# Patient Record
Sex: Female | Born: 1955 | Race: White | Hispanic: No | Marital: Married | Smoking: Former smoker
Health system: Southern US, Community
[De-identification: ages and names within clinical notes are randomized; demographics above are authoritative.]

## PROBLEM LIST (undated history)

## (undated) DIAGNOSIS — B029 Zoster without complications: Secondary | ICD-10-CM

## (undated) DIAGNOSIS — K219 Gastro-esophageal reflux disease without esophagitis: Secondary | ICD-10-CM

## (undated) DIAGNOSIS — G4700 Insomnia, unspecified: Secondary | ICD-10-CM

## (undated) DIAGNOSIS — C801 Malignant (primary) neoplasm, unspecified: Secondary | ICD-10-CM

## (undated) DIAGNOSIS — E785 Hyperlipidemia, unspecified: Secondary | ICD-10-CM

## (undated) DIAGNOSIS — I10 Essential (primary) hypertension: Secondary | ICD-10-CM

## (undated) HISTORY — PX: BREAST BIOPSY: SHX20

## (undated) HISTORY — PX: COLONOSCOPY: SHX174

## (undated) HISTORY — PX: LASER ABLATION OF THE CERVIX: SHX1949

## (undated) HISTORY — PX: ABDOMINAL HYSTERECTOMY: SHX81

## (undated) HISTORY — PX: RADICAL VULVECTOMY: SHX2286

## (undated) HISTORY — PX: BACK SURGERY: SHX140

---

## 2003-11-07 ENCOUNTER — Ambulatory Visit: Payer: Self-pay

## 2005-09-16 ENCOUNTER — Ambulatory Visit: Payer: Self-pay

## 2008-02-26 ENCOUNTER — Ambulatory Visit: Payer: Self-pay | Admitting: Family Medicine

## 2009-09-30 ENCOUNTER — Ambulatory Visit: Payer: Self-pay | Admitting: Family Medicine

## 2010-12-01 ENCOUNTER — Ambulatory Visit: Payer: Self-pay | Admitting: Gynecology

## 2012-03-21 ENCOUNTER — Ambulatory Visit: Payer: Self-pay | Admitting: Gynecology

## 2013-01-18 HISTORY — PX: OTHER SURGICAL HISTORY: SHX169

## 2013-04-17 ENCOUNTER — Ambulatory Visit: Payer: Self-pay | Admitting: Family Medicine

## 2014-06-10 ENCOUNTER — Other Ambulatory Visit: Payer: Self-pay | Admitting: Family Medicine

## 2014-06-10 DIAGNOSIS — Z1231 Encounter for screening mammogram for malignant neoplasm of breast: Secondary | ICD-10-CM

## 2014-06-11 ENCOUNTER — Ambulatory Visit
Admission: RE | Admit: 2014-06-11 | Discharge: 2014-06-11 | Disposition: A | Discharge status: Home / Self Care | Payer: 59 | Source: Ambulatory Visit | Attending: Family Medicine | Admitting: Family Medicine

## 2014-06-11 DIAGNOSIS — Z1231 Encounter for screening mammogram for malignant neoplasm of breast: Secondary | ICD-10-CM | POA: Insufficient documentation

## 2014-06-11 DIAGNOSIS — Z8589 Personal history of malignant neoplasm of other organs and systems: Secondary | ICD-10-CM | POA: Insufficient documentation

## 2014-06-11 HISTORY — DX: Malignant (primary) neoplasm, unspecified: C80.1

## 2014-08-04 ENCOUNTER — Ambulatory Visit
Admission: EM | Admit: 2014-08-04 | Discharge: 2014-08-04 | Disposition: A | Discharge status: Home / Self Care | Payer: 59 | Attending: Internal Medicine | Admitting: Internal Medicine

## 2014-08-04 DIAGNOSIS — H109 Unspecified conjunctivitis: Secondary | ICD-10-CM

## 2014-08-04 DIAGNOSIS — H01003 Unspecified blepharitis right eye, unspecified eyelid: Secondary | ICD-10-CM | POA: Diagnosis not present

## 2014-08-04 DIAGNOSIS — H01006 Unspecified blepharitis left eye, unspecified eyelid: Secondary | ICD-10-CM

## 2014-08-04 HISTORY — DX: Essential (primary) hypertension: I10

## 2014-08-04 HISTORY — DX: Hyperlipidemia, unspecified: E78.5

## 2014-08-04 MED ORDER — PREDNISONE 20 MG PO TABS: 40.0000 mg | ORAL_TABLET | Freq: Every day | ORAL | Status: DC

## 2014-08-04 MED ORDER — SULFACETAMIDE SODIUM 10 % OP SOLN: 1.0000 [drp] | OPHTHALMIC | Status: DC

## 2014-08-04 NOTE — ED Notes (Signed)
Patient had eyelash extensions placed Friday. Thinks she had allergic reaction, eyes swelling and with redness. No visual changes.

## 2014-08-04 NOTE — ED Provider Notes (Signed)
CSN: [id number]     Arrival date & time 08/04/14  0808 History   First MD Initiated Contact with Patient 08/04/14 [phone]     Chief Complaint  Patient presents with  . Facial Swelling   (Consider location/radiation/quality/duration/timing/severity/associated sxs/prior Treatment) HPI   59 yo F had eyelash extentions put on 2 weeks ago with no problem initially. Had "eyelash goop" develop this past Thursday. She reported for repeat extensions on Friday ( 2 day PTA) discharge not considered significant and new extensions added. Patient developed inflamed irritated eyelids upon awakening yesterday. Intense itching and she rubbed a lot.Lids swollen,tender,eyes injected,no visual changes . Melted off her eyelashes with olive oil per instructions. Eyes aching.  Past Medical History  Diagnosis Date  . Cancer     vulva cancer  . Hypertension   . Hyperlipidemia    Past Surgical History  Procedure Laterality Date  . Breast biopsy Right     neg   History reviewed. No pertinent family history. History  Substance Use Topics  . Smoking status: Former Research scientist (life sciences)  . Smokeless tobacco: Never Used  . Alcohol Use: No   OB History    No data available     Review of Systems Review of 10 systems negative for acute change except as referenced in HPI  Allergies  Review of patient's allergies indicates no known allergies.  Home Medications   Prior to Admission medications   Medication Sig Start Date End Date Taking? Authorizing Provider  hydrochlorothiazide (MICROZIDE) 12.5 MG capsule Take 10 mg by mouth daily.   Yes Historical Provider, MD  lisinopril (PRINIVIL,ZESTRIL) 5 MG tablet Take 5 mg by mouth daily.   Yes Historical Provider, MD  rosuvastatin (CRESTOR) 20 MG tablet Take 20 mg by mouth daily.   Yes Historical Provider, MD  predniSONE (DELTASONE) 20 MG tablet Take 2 tablets (40 mg total) by mouth daily with breakfast. 08/04/14   Jan Fireman, PA-C  sulfacetamide (BLEPH-10) 10 % ophthalmic solution  Place 1-2 drops into both eyes every 4 (four) hours. 08/04/14   Jan Fireman, PA-C   BP 146/87 mmHg  Pulse 82  Temp(Src) 97.4 F (36.3 C) (Tympanic)  Resp 20  Ht 5\' 2"  (1.575 m)  Wt 170 lb (77.111 kg)  BMI 31.09 kg/m2  SpO2 96% Physical Exam   Constitutional -alert and oriented, Head-atraumatic, normocephalic Eyes- angry injected conjunctiva bilaterally ,EOMI intact ,conjugate gaze; lids bilaterally inflamed with abraded lash line especially on the left. She aknowledges much rubbing. Fluid filled lower lids and "bags" bilaterally. Acuity R and L  20/25 Nose- no congestion or rhinorrhea Mouth/throat- mucous membranes moist , Neck- supple CV- regular rate, grossly normal heart sounds,  Resp-no distress, normal respiratory effort,clear to auscultation bilaterally GI- no distention GU- not examined MSK-  ambulatory, self-care Neuro- normal speech and language, Skin-warm,dry ,intact; see HPI Psych-mood and affect grossly normal; speech and behavior grossly normal  ED Course  Procedures (including critical care time) Labs Review Labs Reviewed - No data to display  Imaging Review No results found.   MDM   1. Blepharitis of both eyes   2. Bilateral conjunctivitis     Plan: 1. diagnosis reviewed with patient 2. Rx as per orders; risks, benefits, potential side effects reviewed with patient 3. Recommend supportive treatment with cool eye mask-rest supine-don't touch -clean hands 4. F/u prn if symptoms worsen or don't improve   Discharge Medication List as of 08/04/2014  8:56 AM    START taking these medications  Details  predniSONE (DELTASONE) 20 MG tablet Take 2 tablets (40 mg total) by mouth daily with breakfast., Starting 08/04/2014, Until Discontinued, Normal    sulfacetamide (BLEPH-10) 10 % ophthalmic solution Place 1-2 drops into both eyes every 4 (four) hours., Starting 08/04/2014, Until Discontinued, Normal          Jan Fireman, PA-C 08/05/14 [phone]

## 2014-08-05 ENCOUNTER — Encounter: Payer: Self-pay | Admitting: Physician Assistant

## 2015-03-19 HISTORY — PX: BLADDER SUSPENSION: SHX72

## 2015-06-17 ENCOUNTER — Other Ambulatory Visit: Payer: Self-pay | Admitting: Gynecology

## 2015-06-17 DIAGNOSIS — Z1231 Encounter for screening mammogram for malignant neoplasm of breast: Secondary | ICD-10-CM

## 2015-06-18 ENCOUNTER — Ambulatory Visit: Payer: Self-pay

## 2015-07-01 ENCOUNTER — Ambulatory Visit: Payer: Self-pay

## 2015-07-01 ENCOUNTER — Ambulatory Visit: Admission: RE | Admit: 2015-07-01 | Payer: Self-pay | Source: Ambulatory Visit

## 2015-07-02 ENCOUNTER — Ambulatory Visit
Admission: RE | Admit: 2015-07-02 | Discharge: 2015-07-02 | Disposition: A | Discharge status: Home / Self Care | Payer: BLUE CROSS/BLUE SHIELD | Source: Ambulatory Visit | Attending: Gynecology | Admitting: Gynecology

## 2015-07-02 DIAGNOSIS — R928 Other abnormal and inconclusive findings on diagnostic imaging of breast: Secondary | ICD-10-CM | POA: Diagnosis not present

## 2015-07-02 DIAGNOSIS — Z1231 Encounter for screening mammogram for malignant neoplasm of breast: Secondary | ICD-10-CM | POA: Diagnosis not present

## 2015-07-07 ENCOUNTER — Other Ambulatory Visit: Payer: Self-pay | Admitting: Gynecology

## 2015-07-07 DIAGNOSIS — R928 Other abnormal and inconclusive findings on diagnostic imaging of breast: Secondary | ICD-10-CM

## 2015-07-17 ENCOUNTER — Other Ambulatory Visit: Payer: Self-pay | Admitting: Gynecology

## 2015-07-17 ENCOUNTER — Ambulatory Visit
Admission: RE | Admit: 2015-07-17 | Discharge: 2015-07-17 | Disposition: A | Discharge status: Home / Self Care | Payer: BLUE CROSS/BLUE SHIELD | Source: Ambulatory Visit | Attending: Gynecology | Admitting: Gynecology

## 2015-07-17 DIAGNOSIS — R928 Other abnormal and inconclusive findings on diagnostic imaging of breast: Secondary | ICD-10-CM

## 2015-07-23 ENCOUNTER — Other Ambulatory Visit: Payer: Self-pay | Admitting: General Practice

## 2015-07-23 ENCOUNTER — Other Ambulatory Visit: Payer: Self-pay | Admitting: Gynecology

## 2015-07-23 DIAGNOSIS — R928 Other abnormal and inconclusive findings on diagnostic imaging of breast: Secondary | ICD-10-CM

## 2015-07-24 ENCOUNTER — Ambulatory Visit
Admission: RE | Admit: 2015-07-24 | Discharge: 2015-07-24 | Disposition: A | Discharge status: Home / Self Care | Payer: BLUE CROSS/BLUE SHIELD | Source: Ambulatory Visit | Attending: Gynecology | Admitting: Gynecology

## 2015-07-24 DIAGNOSIS — R928 Other abnormal and inconclusive findings on diagnostic imaging of breast: Secondary | ICD-10-CM

## 2015-10-20 ENCOUNTER — Ambulatory Visit: Payer: Self-pay | Admitting: General Surgery

## 2015-10-20 DIAGNOSIS — N6022 Fibroadenosis of left breast: Secondary | ICD-10-CM

## 2015-10-28 ENCOUNTER — Other Ambulatory Visit: Payer: Self-pay | Admitting: General Surgery

## 2015-10-28 DIAGNOSIS — N6022 Fibroadenosis of left breast: Secondary | ICD-10-CM

## 2015-11-13 ENCOUNTER — Encounter (HOSPITAL_COMMUNITY)
Admission: RE | Admit: 2015-11-13 | Discharge: 2015-11-13 | Disposition: A | Discharge status: Home / Self Care | Payer: BLUE CROSS/BLUE SHIELD | Source: Ambulatory Visit | Attending: General Surgery | Admitting: General Surgery

## 2015-11-13 ENCOUNTER — Other Ambulatory Visit: Payer: Self-pay

## 2015-11-13 ENCOUNTER — Encounter (HOSPITAL_COMMUNITY): Payer: Self-pay

## 2015-11-13 DIAGNOSIS — N6489 Other specified disorders of breast: Secondary | ICD-10-CM | POA: Insufficient documentation

## 2015-11-13 DIAGNOSIS — Z01810 Encounter for preprocedural cardiovascular examination: Secondary | ICD-10-CM | POA: Insufficient documentation

## 2015-11-13 DIAGNOSIS — Z01812 Encounter for preprocedural laboratory examination: Secondary | ICD-10-CM | POA: Diagnosis not present

## 2015-11-13 HISTORY — DX: Gastro-esophageal reflux disease without esophagitis: K21.9

## 2015-11-13 HISTORY — DX: Insomnia, unspecified: G47.00

## 2015-11-13 LAB — CBC
HEMATOCRIT: 42.7 % (ref 36.0–46.0)
Hemoglobin: 13.7 g/dL (ref 12.0–15.0)
MCH: 29.7 pg (ref 26.0–34.0)
MCHC: 32.1 g/dL (ref 30.0–36.0)
MCV: 92.4 fL (ref 78.0–100.0)
Platelets: 313 10*3/uL (ref 150–400)
RBC: 4.62 MIL/uL (ref 3.87–5.11)
RDW: 13.3 % (ref 11.5–15.5)
WBC: 13.4 10*3/uL — ABNORMAL HIGH (ref 4.0–10.5)

## 2015-11-13 LAB — BASIC METABOLIC PANEL
ANION GAP: 10 (ref 5–15)
BUN: 17 mg/dL (ref 6–20)
CALCIUM: 9.6 mg/dL (ref 8.9–10.3)
CO2: 29 mmol/L (ref 22–32)
CREATININE: 1.24 mg/dL — AB (ref 0.44–1.00)
Chloride: 99 mmol/L — ABNORMAL LOW (ref 101–111)
GFR calc Af Amer: 54 mL/min — ABNORMAL LOW (ref >60)
GFR calc non Af Amer: 47 mL/min — ABNORMAL LOW (ref >60)
GLUCOSE: 165 mg/dL — AB (ref 65–99)
Potassium: 4.3 mmol/L (ref 3.5–5.1)
Sodium: 138 mmol/L (ref 135–145)

## 2015-11-13 MED ORDER — CHLORHEXIDINE GLUCONATE CLOTH 2 % EX PADS: 6.0000 | MEDICATED_PAD | Freq: Once | CUTANEOUS | Status: DC

## 2015-11-13 NOTE — Progress Notes (Signed)
Cardiologist denies  [facility],sees NP Verita Lamb  Echo/stress test/heart cath denies  EKG denies in past yr  CXR denies in past yr

## 2015-11-13 NOTE — Pre-Procedure Instructions (Signed)
Loretta Barker  11/13/2015      [id number] [place], [place] - [address] [place] [address] Phone: [phone] Fax: [phone]    Your procedure is scheduled on Wed, Nov 1 @ 8:30 AM  Report to [facility] Admitting at 6:30 AM  Call this number if you have problems the morning of surgery:  [phone]   Remember:  Do not eat food or drink liquids after midnight.   No Goody's,BC's,Aleve,Advil,Motrin,Ibuprofen,Fish Oil,or any Herbal Medications.    Do not wear jewelry, make-up or nail polish.  Do not wear lotions, powders, perfumes, or deoderant.  Do not shave 48 hours prior to surgery.    Do not bring valuables to the hospital.  [facility] is not responsible for any belongings or valuables.  Contacts, dentures or bridgework may not be worn into surgery.  Leave your suitcase in the car.  After surgery it may be brought to your room.  For patients admitted to the hospital, discharge time will be determined by your treatment team.  Patients discharged the day of surgery will not be allowed to drive home.   [facility]  Before surgery, you can play an important role.  Because skin is not sterile, your skin needs to be as free of germs as possible.  You can reduce the number of germs on you skin by washing with CHG (chlorahexidine gluconate) soap before surgery.  CHG is an antiseptic cleaner which kills germs and bonds with the skin to continue killing germs even after washing.  Please DO NOT use if you have an allergy to CHG or antibacterial soaps.  If your skin becomes reddened/irritated stop using the CHG and inform your nurse when you arrive at Short Stay.  Do not shave (including legs and underarms) for at least 48 hours prior to the first CHG shower.  You may shave your face.  Please follow these instructions carefully:   1.  Shower with CHG Soap the night before surgery and the                                 morning of Surgery.  2.  If you choose to wash your hair, wash your hair first as usual with your       normal shampoo.  3.  After you shampoo, rinse your hair and body thoroughly to remove the                      Shampoo.  4.  Use CHG as you would any other liquid soap.  You can apply chg directly       to the skin and wash gently with scrungie or a clean washcloth.  5.  Apply the CHG Soap to your body ONLY FROM THE NECK DOWN.        Do not use on open wounds or open sores.  Avoid contact with your eyes,       ears, mouth and genitals (private parts).  Wash genitals (private parts)       with your normal soap.  6.  Wash thoroughly, paying special attention to the area where your surgery        will be performed.  7.  Thoroughly rinse your body with warm water from the neck down.  8.  DO NOT shower/wash with  your normal soap after using and rinsing off       the CHG Soap.  9.  Pat yourself dry with a clean towel.            10.  Wear clean pajamas.            11.  Place clean sheets on your bed the night of your first shower and do not        sleep with pets.  Day of Surgery  Do not apply any lotions/deoderants the morning of surgery.  Please wear clean clothes to the hospital/surgery center.    Please read over the following fact sheets that you were given. Pain Booklet, Coughing and Deep Breathing and Surgical Site Infection Prevention

## 2015-11-18 ENCOUNTER — Ambulatory Visit
Admission: RE | Admit: 2015-11-18 | Discharge: 2015-11-18 | Disposition: A | Discharge status: Home / Self Care | Payer: BLUE CROSS/BLUE SHIELD | Source: Ambulatory Visit | Attending: General Surgery | Admitting: General Surgery

## 2015-11-18 DIAGNOSIS — N6022 Fibroadenosis of left breast: Secondary | ICD-10-CM

## 2015-11-19 ENCOUNTER — Ambulatory Visit
Admission: RE | Admit: 2015-11-19 | Discharge: 2015-11-19 | Disposition: A | Discharge status: Home / Self Care | Payer: BLUE CROSS/BLUE SHIELD | Source: Ambulatory Visit | Attending: General Surgery | Admitting: General Surgery

## 2015-11-19 ENCOUNTER — Ambulatory Visit (HOSPITAL_COMMUNITY)
Admission: RE | Admit: 2015-11-19 | Discharge: 2015-11-19 | Disposition: A | Discharge status: Home / Self Care | Payer: BLUE CROSS/BLUE SHIELD | Source: Ambulatory Visit | Attending: General Surgery | Admitting: General Surgery

## 2015-11-19 ENCOUNTER — Encounter (HOSPITAL_COMMUNITY)
Admission: RE | Disposition: A | Discharge status: Home / Self Care | Payer: Self-pay | Source: Ambulatory Visit | Attending: General Surgery

## 2015-11-19 ENCOUNTER — Ambulatory Visit (HOSPITAL_COMMUNITY): Payer: BLUE CROSS/BLUE SHIELD | Admitting: Anesthesiology

## 2015-11-19 ENCOUNTER — Encounter (HOSPITAL_COMMUNITY): Payer: Self-pay | Admitting: Certified Registered"

## 2015-11-19 DIAGNOSIS — Z8541 Personal history of malignant neoplasm of cervix uteri: Secondary | ICD-10-CM | POA: Insufficient documentation

## 2015-11-19 DIAGNOSIS — F329 Major depressive disorder, single episode, unspecified: Secondary | ICD-10-CM | POA: Insufficient documentation

## 2015-11-19 DIAGNOSIS — N6022 Fibroadenosis of left breast: Secondary | ICD-10-CM | POA: Insufficient documentation

## 2015-11-19 DIAGNOSIS — Z87891 Personal history of nicotine dependence: Secondary | ICD-10-CM | POA: Diagnosis not present

## 2015-11-19 DIAGNOSIS — N6012 Diffuse cystic mastopathy of left breast: Secondary | ICD-10-CM | POA: Diagnosis not present

## 2015-11-19 DIAGNOSIS — K219 Gastro-esophageal reflux disease without esophagitis: Secondary | ICD-10-CM | POA: Diagnosis not present

## 2015-11-19 DIAGNOSIS — G4730 Sleep apnea, unspecified: Secondary | ICD-10-CM | POA: Diagnosis not present

## 2015-11-19 DIAGNOSIS — E7800 Pure hypercholesterolemia, unspecified: Secondary | ICD-10-CM | POA: Diagnosis not present

## 2015-11-19 DIAGNOSIS — I10 Essential (primary) hypertension: Secondary | ICD-10-CM | POA: Insufficient documentation

## 2015-11-19 DIAGNOSIS — Z90710 Acquired absence of both cervix and uterus: Secondary | ICD-10-CM | POA: Diagnosis not present

## 2015-11-19 HISTORY — PX: BREAST LUMPECTOMY WITH RADIOACTIVE SEED LOCALIZATION: SHX6424

## 2015-11-19 HISTORY — PX: BREAST EXCISIONAL BIOPSY: SUR124

## 2015-11-19 SURGERY — BREAST LUMPECTOMY WITH RADIOACTIVE SEED LOCALIZATION
Anesthesia: General | Laterality: Left

## 2015-11-19 MED ORDER — LACTATED RINGERS IV SOLN
INTRAVENOUS | Status: DC | PRN
  Administered 2015-11-19 (×2): via INTRAVENOUS

## 2015-11-19 MED ORDER — FENTANYL CITRATE (PF) 100 MCG/2ML IJ SOLN
25.0000 ug | INTRAMUSCULAR | Status: DC | PRN
  Administered 2015-11-19 (×3): 25 ug via INTRAVENOUS
  Administered 2015-11-19: 50 ug via INTRAVENOUS
  Administered 2015-11-19: 25 ug via INTRAVENOUS

## 2015-11-19 MED ORDER — PROPOFOL 10 MG/ML IV BOLUS
INTRAVENOUS | Status: AC
  Filled 2015-11-19: qty 20

## 2015-11-19 MED ORDER — LIDOCAINE 2% (20 MG/ML) 5 ML SYRINGE
INTRAMUSCULAR | Status: AC
  Filled 2015-11-19: qty 5

## 2015-11-19 MED ORDER — OXYCODONE HCL 5 MG/5ML PO SOLN: 5.0000 mg | Freq: Once | ORAL | Status: AC | PRN

## 2015-11-19 MED ORDER — LIDOCAINE 2% (20 MG/ML) 5 ML SYRINGE
INTRAMUSCULAR | Status: DC | PRN
  Administered 2015-11-19: 50 mg via INTRAVENOUS

## 2015-11-19 MED ORDER — OXYCODONE HCL 5 MG PO TABS
ORAL_TABLET | ORAL | Status: AC
  Filled 2015-11-19: qty 1

## 2015-11-19 MED ORDER — BUPIVACAINE-EPINEPHRINE (PF) 0.5% -1:200000 IJ SOLN
INTRAMUSCULAR | Status: AC
  Filled 2015-11-19: qty 30

## 2015-11-19 MED ORDER — 0.9 % SODIUM CHLORIDE (POUR BTL) OPTIME
TOPICAL | Status: DC | PRN
  Administered 2015-11-19: 1000 mL

## 2015-11-19 MED ORDER — MIDAZOLAM HCL 5 MG/5ML IJ SOLN
INTRAMUSCULAR | Status: DC | PRN
  Administered 2015-11-19: 2 mg via INTRAVENOUS

## 2015-11-19 MED ORDER — ONDANSETRON HCL 4 MG/2ML IJ SOLN
INTRAMUSCULAR | Status: DC | PRN
  Administered 2015-11-19: 4 mg via INTRAVENOUS

## 2015-11-19 MED ORDER — PROPOFOL 10 MG/ML IV BOLUS
INTRAVENOUS | Status: DC | PRN
  Administered 2015-11-19: 160 mg via INTRAVENOUS

## 2015-11-19 MED ORDER — MIDAZOLAM HCL 2 MG/2ML IJ SOLN
INTRAMUSCULAR | Status: AC
  Filled 2015-11-19: qty 2

## 2015-11-19 MED ORDER — ONDANSETRON HCL 4 MG/2ML IJ SOLN: 4.0000 mg | Freq: Once | INTRAMUSCULAR | Status: DC | PRN

## 2015-11-19 MED ORDER — BUPIVACAINE-EPINEPHRINE 0.5% -1:200000 IJ SOLN
INTRAMUSCULAR | Status: DC | PRN
  Administered 2015-11-19: 10 mL

## 2015-11-19 MED ORDER — FENTANYL CITRATE (PF) 100 MCG/2ML IJ SOLN
INTRAMUSCULAR | Status: DC
Start: 2015-11-19 — End: 2015-11-19
  Filled 2015-11-19: qty 2

## 2015-11-19 MED ORDER — FENTANYL CITRATE (PF) 100 MCG/2ML IJ SOLN
INTRAMUSCULAR | Status: AC
  Filled 2015-11-19: qty 2

## 2015-11-19 MED ORDER — CEFAZOLIN SODIUM-DEXTROSE 2-4 GM/100ML-% IV SOLN
2.0000 g | INTRAVENOUS | Status: AC
  Administered 2015-11-19: 2 g via INTRAVENOUS

## 2015-11-19 MED ORDER — LACTATED RINGERS IV SOLN: INTRAVENOUS | Status: DC | PRN

## 2015-11-19 MED ORDER — ONDANSETRON HCL 4 MG/2ML IJ SOLN
INTRAMUSCULAR | Status: AC
  Filled 2015-11-19: qty 2

## 2015-11-19 MED ORDER — OXYCODONE HCL 5 MG PO TABS
5.0000 mg | ORAL_TABLET | Freq: Once | ORAL | Status: AC | PRN
  Administered 2015-11-19: 5 mg via ORAL

## 2015-11-19 MED ORDER — HYDROCODONE-ACETAMINOPHEN 5-325 MG PO TABS: 1.0000 | ORAL_TABLET | ORAL | 0 refills | Status: DC | PRN

## 2015-11-19 MED ORDER — CEFAZOLIN SODIUM-DEXTROSE 2-4 GM/100ML-% IV SOLN
INTRAVENOUS | Status: AC
  Filled 2015-11-19: qty 100

## 2015-11-19 MED ORDER — FENTANYL CITRATE (PF) 100 MCG/2ML IJ SOLN
INTRAMUSCULAR | Status: DC | PRN
  Administered 2015-11-19 (×4): 25 ug via INTRAVENOUS

## 2015-11-19 SURGICAL SUPPLY — 37 items
APPLIER CLIP 9.375 MED OPEN (MISCELLANEOUS)
BLADE SURG 15 STRL LF DISP TIS (BLADE) ×1 IMPLANT
BLADE SURG 15 STRL SS (BLADE) ×2
CANISTER SUCTION 2500CC (MISCELLANEOUS) ×3 IMPLANT
CHLORAPREP W/TINT 26ML (MISCELLANEOUS) ×3 IMPLANT
CLIP APPLIE 9.375 MED OPEN (MISCELLANEOUS) IMPLANT
COVER PROBE W GEL 5X96 (DRAPES) ×3 IMPLANT
COVER SURGICAL LIGHT HANDLE (MISCELLANEOUS) ×3 IMPLANT
DERMABOND ADVANCED (GAUZE/BANDAGES/DRESSINGS) ×2
DERMABOND ADVANCED .7 DNX12 (GAUZE/BANDAGES/DRESSINGS) ×1 IMPLANT
DEVICE DUBIN SPECIMEN MAMMOGRA (MISCELLANEOUS) ×3 IMPLANT
DRAPE CHEST BREAST 15X10 FENES (DRAPES) ×3 IMPLANT
DRAPE UTILITY XL STRL (DRAPES) ×3 IMPLANT
ELECT COATED BLADE 2.86 ST (ELECTRODE) ×3 IMPLANT
ELECT REM PT RETURN 9FT ADLT (ELECTROSURGICAL) ×3
ELECTRODE REM PT RTRN 9FT ADLT (ELECTROSURGICAL) ×1 IMPLANT
GLOVE BIO SURGEON STRL SZ7.5 (GLOVE) ×6 IMPLANT
GOWN STRL REUS W/ TWL LRG LVL3 (GOWN DISPOSABLE) ×2 IMPLANT
GOWN STRL REUS W/TWL LRG LVL3 (GOWN DISPOSABLE) ×4
KIT BASIN OR (CUSTOM PROCEDURE TRAY) ×3 IMPLANT
KIT MARKER MARGIN INK (KITS) ×3 IMPLANT
LIGHT WAVEGUIDE WIDE FLAT (MISCELLANEOUS) ×3 IMPLANT
NEEDLE HYPO 25X1 1.5 SAFETY (NEEDLE) ×3 IMPLANT
NS IRRIG 1000ML POUR BTL (IV SOLUTION) ×3 IMPLANT
PACK SURGICAL SETUP 50X90 (CUSTOM PROCEDURE TRAY) ×3 IMPLANT
PENCIL BUTTON HOLSTER BLD 10FT (ELECTRODE) ×3 IMPLANT
SPONGE LAP 18X18 X RAY DECT (DISPOSABLE) ×3 IMPLANT
SUT MNCRL AB 4-0 PS2 18 (SUTURE) ×3 IMPLANT
SUT SILK 2 0 SH (SUTURE) IMPLANT
SUT VIC AB 3-0 SH 18 (SUTURE) ×3 IMPLANT
SYR BULB 3OZ (MISCELLANEOUS) ×3 IMPLANT
SYR CONTROL 10ML LL (SYRINGE) ×3 IMPLANT
TOWEL OR 17X24 6PK STRL BLUE (TOWEL DISPOSABLE) ×3 IMPLANT
TOWEL OR 17X26 10 PK STRL BLUE (TOWEL DISPOSABLE) ×3 IMPLANT
TUBE CONNECTING 12'X1/4 (SUCTIONS) ×1
TUBE CONNECTING 12X1/4 (SUCTIONS) ×2 IMPLANT
YANKAUER SUCT BULB TIP NO VENT (SUCTIONS) ×3 IMPLANT

## 2015-11-19 NOTE — Anesthesia Procedure Notes (Signed)
Procedure Name: LMA Insertion Date/Time: 11/19/2015 8:29 AM Performed by: Manuela Schwartz B Pre-anesthesia Checklist: Patient identified, Emergency Drugs available, Suction available, Timeout performed and Patient being monitored Patient Re-evaluated:Patient Re-evaluated prior to inductionOxygen Delivery Method: Circle system utilized Preoxygenation: Pre-oxygenation with 100% oxygen Intubation Type: IV induction LMA: LMA inserted LMA Size: 4.0 Number of attempts: 1 Placement Confirmation: positive ETCO2 and breath sounds checked- equal and bilateral Tube secured with: Tape Dental Injury: Teeth and Oropharynx as per pre-operative assessment

## 2015-11-19 NOTE — Interval H&P Note (Signed)
History and Physical Interval Note:  11/19/2015 7:07 AM  Loretta Barker  has presented today for surgery, with the diagnosis of LEFT BREAST COMPLEX SCLEROSING LESION  The various methods of treatment have been discussed with the patient and family. After consideration of risks, benefits and other options for treatment, the patient has consented to  Procedure(s): LEFT BREAST LUMPECTOMY WITH RADIOACTIVE SEED LOCALIZATION (Left) as a surgical intervention .  The patient's history has been reviewed, patient examined, no change in status, stable for surgery.  I have reviewed the patient's chart and labs.  Questions were answered to the patient's satisfaction.     TOTH III,PAUL S

## 2015-11-19 NOTE — Op Note (Signed)
11/19/2015  9:25 AM  PATIENT:  Loretta Barker  60 y.o. female  PRE-OPERATIVE DIAGNOSIS:  LEFT BREAST COMPLEX SCLEROSING LESION  POST-OPERATIVE DIAGNOSIS:  LEFT BREAST COMPLEX SCLEROSING LESION  PROCEDURE:  Procedure(s): LEFT BREAST LUMPECTOMY WITH RADIOACTIVE SEED LOCALIZATION (Left)  SURGEON:  Surgeon(s) and Role:    * Jovita Kussmaul, MD - Primary  PHYSICIAN ASSISTANT:   ASSISTANTS: none   ANESTHESIA:   local and general  EBL:  Total I/O In: 1000 [I.V.:1000] Out: -   BLOOD ADMINISTERED:none  DRAINS: none   LOCAL MEDICATIONS USED:  MARCAINE     SPECIMEN:  Source of Specimen:  left breast tissue  DISPOSITION OF SPECIMEN:  PATHOLOGY  COUNTS:  YES  TOURNIQUET:  * No tourniquets in log *  DICTATION: .Dragon Dictation   After informed consent was obtained the patient was brought to the operating room and placed in the supine position on the operating room table. After adequate induction of general anesthesia the patient's left breast was prepped with ChloraPrep, allowed to dry, and draped in usual sterile manner. An appropriate timeout was performed. Previously an I-125 seed was placed in the central left breast to mark an area of a complex sclerosing lesion. I elected to access the breast tissue through a inframammary incision. This area was infiltrated with half percent Marcaine and an inframammary incision was made with a 15 blade knife. The incision was carried through the skin and subcutaneous tissue sharply with the electrocautery until the breast tissue was entered. The dissection was directed by the use of a neoprobe. Once we began to approach the area of the radioactive seed and then removed a circular portion of breast tissue around the radioactive seed while checking the area of radioactivity frequently. Once the specimen was removed it was oriented with the appropriate paint colors. A specimen radiograph showed the clip in seed to be within the specimen. The specimen  was then sent to pathology for further evaluation. Hemostasis was achieved using the Bovie electrocautery. The wound was irrigated with saline and infiltrated with quarter percent Marcaine. The cavity was marked with 2 clips. The deep layer of the wound was then closed with layers of interrupted 3-0 Vicryl stitches. The skin was then closed with interrupted 4-0 Monocryl subcuticular stitches. Dermabond dressings were applied. The patient tolerated the procedure well. At the end of the case all needle sponge and instrument counts were correct. The patient was then awakened and taken to recovery in stable condition.  PLAN OF CARE: Discharge to home after PACU  PATIENT DISPOSITION:  PACU - hemodynamically stable.   Delay start of Pharmacological VTE agent (>24hrs) due to surgical blood loss or risk of bleeding: not applicable

## 2015-11-19 NOTE — Anesthesia Preprocedure Evaluation (Addendum)
Anesthesia Evaluation  Patient identified by MRN, date of birth, ID band Patient awake    Reviewed: Allergy & Precautions, NPO status , Patient's Chart, lab work & pertinent test results  Airway Mallampati: II  TM Distance: >3 FB Neck ROM: Full    Dental  (+) Teeth Intact, Dental Advisory Given   Pulmonary former smoker,    breath sounds clear to auscultation       Cardiovascular hypertension,  Rhythm:Regular Rate:Normal     Neuro/Psych    GI/Hepatic   Endo/Other    Renal/GU      Musculoskeletal   Abdominal   Peds  Hematology   Anesthesia Other Findings   Reproductive/Obstetrics                            Anesthesia Physical Anesthesia Plan  ASA: II  Anesthesia Plan: General   Post-op Pain Management:    Induction: Intravenous  Airway Management Planned: LMA  Additional Equipment:   Intra-op Plan:   Post-operative Plan:   Informed Consent: I have reviewed the patients History and Physical, chart, labs and discussed the procedure including the risks, benefits and alternatives for the proposed anesthesia with the patient or authorized representative who has indicated his/her understanding and acceptance.   Dental advisory given  Plan Discussed with: CRNA and Anesthesiologist  Anesthesia Plan Comments:        Anesthesia Quick Evaluation

## 2015-11-19 NOTE — Transfer of Care (Signed)
Immediate Anesthesia Transfer of Care Note  Patient: Loretta Barker  Procedure(s) Performed: Procedure(s): LEFT BREAST LUMPECTOMY WITH RADIOACTIVE SEED LOCALIZATION (Left)  Patient Location: PACU  Anesthesia Type:General  Level of Consciousness: awake, alert  and oriented  Airway & Oxygen Therapy: Patient Spontanous Breathing and Patient connected to face mask oxygen  Post-op Assessment: Report given to RN and Post -op Vital signs reviewed and stable  Post vital signs: Reviewed and stable  Last Vitals:  Vitals:   11/19/15 0935 11/19/15 0945  BP:  (!) 162/73  Pulse:  91  Resp:  (!) 8  Temp: 36.4 C     Last Pain:  Vitals:   11/19/15 0945  TempSrc:   PainSc: Asleep      Patients Stated Pain Goal: 3 ([id number])  Complications: No apparent anesthesia complications

## 2015-11-19 NOTE — H&P (Signed)
Loretta Barker. Loretta Barker  Location: [facility] Patient #: [id number] DOB: 08/26/1955 Married / Language: English / Race: White Female   History of Present Illness  The patient is a 60 year old female who presents with a breast mass. We are asked to see the patient in consultation by Dr. Jetta Lout to evaluate her for a complex sclerosing lesion of the left breast. The patient is a 60 year old white female who recently went for a routine screening mammogram. At that time she was found to have an area of architectural distortion that was small in the deep 6:00 subareolar position of the left breast. This was biopsied and came back as a complex sclerosing lesion. The patient does have a personal history of vulvar and cervical cancer approximately 20 years ago. She has no personal or family history of breast cancer. 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 She has been thinking about this for a number of years.   Other Problems  Back Pain Bladder Problems Cancer Cervical Cancer Depression Gastroesophageal Reflux Disease Hemorrhoids High blood pressure Hypercholesterolemia Lump In Breast Oophorectomy Left. Sleep Apnea  Past Surgical History  Breast Biopsy Left. Hysterectomy (due to cancer) - Complete  Diagnostic Studies History  Colonoscopy 5-10 years ago Mammogram within last year Pap Smear 1-5 years ago  Allergies  No Known Drug Allergies  Medication History  HydroCHLOROthiazide (12.5MG  Capsule, Oral) Active. Lisinopril (5MG  Tablet, Oral) Active. Crestor (20MG  Tablet, Oral) Active. PredniSONE (20MG  Tablet, Oral two tablets daily) Active. Medications Reconciled  Social History  Alcohol use Occasional alcohol use. Caffeine use Carbonated beverages, Coffee. No drug use Tobacco use Former smoker.  Family History Heart  disease in female family member before age 88 Heart disease in female family member before age 63 Hypertension Father, Mother.  Pregnancy / Birth History  Age at menarche 11 years. Age of menopause 63-50 Contraceptive History Oral contraceptives. Gravida 3 Irregular periods Maternal age 43-20 Para 2    Review of Systems  General Not Present- Appetite Loss, Chills, Fatigue, Fever, Night Sweats, Weight Gain and Weight Loss. Skin Not Present- Change in Wart/Mole, Dryness, Hives, Jaundice, New Lesions, Non-Healing Wounds, Rash and Ulcer. HEENT Present- Wears glasses/contact lenses. Not Present- Earache, Hearing Loss, Hoarseness, Nose Bleed, Oral Ulcers, Ringing in the Ears, Seasonal Allergies, Sinus Pain, Sore Throat, Visual Disturbances and Yellow Eyes. Respiratory Not Present- Bloody sputum, Chronic Cough, Difficulty Breathing, Snoring and Wheezing. Breast Present- Breast Mass. Not Present- Breast Pain, Nipple Discharge and Skin Changes. Cardiovascular Present- Leg Cramps. Not Present- Chest Pain, Difficulty Breathing Lying Down, Palpitations, Rapid Heart Rate, Shortness of Breath and Swelling of Extremities. Gastrointestinal Not Present- Abdominal Pain, Bloating, Bloody Stool, Change in Bowel Habits, Chronic diarrhea, Constipation, Difficulty Swallowing, Excessive gas, Gets full quickly at meals, Hemorrhoids, Indigestion, Nausea, Rectal Pain and Vomiting. Female Genitourinary Not Present- Frequency, Nocturia, Painful Urination, Pelvic Pain and Urgency. Musculoskeletal Not Present- Back Pain, Joint Pain, Joint Stiffness, Muscle Pain, Muscle Weakness and Swelling of Extremities. Neurological Not Present- Decreased Memory, Fainting, Headaches, Numbness, Seizures, Tingling, Tremor, Trouble walking and Weakness. Psychiatric Not Present- Anxiety, Bipolar, Change in Sleep Pattern, Depression, Fearful and Frequent crying. Endocrine Present- Hot flashes. Not Present- Cold Intolerance,  Excessive Hunger, Hair Changes, Heat Intolerance and New Diabetes. Hematology Not Present- Blood Thinners, Easy Bruising, Excessive bleeding, Gland problems, HIV and Persistent Infections.  Vitals  Weight: 173 lb Height:  62in Body Surface Area: 1.8 m Body Mass Index: 31.64 kg/m  Temp.: 97.1F  Pulse: 52 (Regular)  BP: 126/78 (Sitting, Left Arm, Standard)       Physical Exam  General Mental Status-Alert. General Appearance-Consistent with stated age. Hydration-Well hydrated. Voice-Normal.  Head and Neck Head-normocephalic, atraumatic with no lesions or palpable masses. Trachea-midline. Thyroid Gland Characteristics - normal size and consistency.  Eye Eyeball - Bilateral-Extraocular movements intact. Sclera/Conjunctiva - Bilateral-No scleral icterus.  Chest and Lung Exam Chest and lung exam reveals -quiet, even and easy respiratory effort with no use of accessory muscles and on auscultation, normal breath sounds, no adventitious sounds and normal vocal resonance. Inspection Chest Wall - Normal. Back - normal.  Breast Note: She has very large pendulous breasts bilaterally. There is no palpable mass in either breast. There is no palpable axillary, supraclavicular, or cervical lymphadenopathy.   Cardiovascular Cardiovascular examination reveals -normal heart sounds, regular rate and rhythm with no murmurs and normal pedal pulses bilaterally.  Abdomen Inspection Inspection of the abdomen reveals - No Hernias. Skin - Scar - no surgical scars. Palpation/Percussion Palpation and Percussion of the abdomen reveal - Soft, Non Tender, No Rebound tenderness, No Rigidity (guarding) and No hepatosplenomegaly. Auscultation Auscultation of the abdomen reveals - Bowel sounds normal.  Neurologic Neurologic evaluation reveals -alert and oriented x 3 with no impairment of recent or remote memory. Mental Status-Normal.  Musculoskeletal Normal  Exam - Left-Upper Extremity Strength Normal and Lower Extremity Strength Normal. Normal Exam - Right-Upper Extremity Strength Normal and Lower Extremity Strength Normal.  Lymphatic Head & Neck  General Head & Neck Lymphatics: Bilateral - Description - Normal. Axillary  General Axillary Region: Bilateral - Description - Normal. Tenderness - Non Tender. Femoral & Inguinal  Generalized Femoral & Inguinal Lymphatics: Bilateral - Description - Normal. Tenderness - Non Tender.    Assessment & Plan SCLEROSING ADENOSIS OF BREAST, LEFT (N60.22) Impression: The patient appears to have a small area of complex sclerosing lesion in the 6 o'clock position of the left breast in the deep subareolar area. Because of its abnormal appearance and because it is considered a high risk lesion the recommendation would be to have this area removed. I have discussed with her in detail the risks and benefits of the operation to do this as well as some of the technical aspects and she understands and wishes to proceed. She is also very interested in breast reduction. She has thought about it for a long time and it has been recommended to her by her back doctor. I think her lumpectomy could be done as part of a breast reduction. I will refer her to the plastic surgeons to talk about the options for surgery. We will coordinate with them for surgical planning. Current Plans Referred to Surgery - Plastic, for evaluation and follow up (Plastic Surgery). Routine.

## 2015-11-19 NOTE — Anesthesia Postprocedure Evaluation (Signed)
Anesthesia Post Note  Patient: Loretta Barker  Procedure(s) Performed: Procedure(s) (LRB): LEFT BREAST LUMPECTOMY WITH RADIOACTIVE SEED LOCALIZATION (Left)  Patient location during evaluation: PACU Anesthesia Type: General Level of consciousness: awake, awake and alert and oriented Pain management: pain level controlled Vital Signs Assessment: post-procedure vital signs reviewed and stable Respiratory status: spontaneous breathing, nonlabored ventilation and respiratory function stable Cardiovascular status: blood pressure returned to baseline Anesthetic complications: no    Last Vitals:  Vitals:   11/19/15 1045 11/19/15 1107  BP:  130/74  Pulse: 88 72  Resp: 18 16  Temp:      Last Pain:  Vitals:   11/19/15 1107  TempSrc:   PainSc: 2                  Rosanna Bickle COKER

## 2015-11-20 ENCOUNTER — Encounter (HOSPITAL_COMMUNITY): Payer: Self-pay | Admitting: General Surgery

## 2015-12-31 ENCOUNTER — Encounter: Payer: Self-pay | Admitting: Hematology

## 2015-12-31 ENCOUNTER — Telehealth: Payer: Self-pay | Admitting: Hematology

## 2015-12-31 NOTE — Telephone Encounter (Signed)
Tc made to pt to schedule appt. Previous attempts were made on 12/1 and 12/5 w/ no response. Was able to reach the pt today and scheduled appt w/Feng on 12/21 at 11am. Pt agreed. Demographics verified, letter mailed.

## 2016-01-07 ENCOUNTER — Telehealth: Payer: Self-pay | Admitting: Hematology

## 2016-01-07 NOTE — Telephone Encounter (Signed)
Pt cld to cancel appt w/Dr. Burr Medico on 12/21 due to illness. Pt states she will cb in the beginning of the year to reschedule.

## 2016-01-08 ENCOUNTER — Encounter: Payer: BLUE CROSS/BLUE SHIELD | Admitting: Hematology

## 2016-05-04 ENCOUNTER — Other Ambulatory Visit: Payer: Self-pay | Admitting: General Surgery

## 2016-05-04 DIAGNOSIS — N6022 Fibroadenosis of left breast: Secondary | ICD-10-CM

## 2016-05-28 ENCOUNTER — Ambulatory Visit
Admission: RE | Admit: 2016-05-28 | Discharge: 2016-05-28 | Disposition: A | Discharge status: Home / Self Care | Payer: BLUE CROSS/BLUE SHIELD | Source: Ambulatory Visit | Attending: General Surgery | Admitting: General Surgery

## 2016-05-28 DIAGNOSIS — N6022 Fibroadenosis of left breast: Secondary | ICD-10-CM

## 2017-02-09 ENCOUNTER — Other Ambulatory Visit: Payer: Self-pay | Admitting: Gynecology

## 2017-02-09 DIAGNOSIS — Z1231 Encounter for screening mammogram for malignant neoplasm of breast: Secondary | ICD-10-CM

## 2017-03-01 ENCOUNTER — Ambulatory Visit
Admission: RE | Admit: 2017-03-01 | Discharge: 2017-03-01 | Disposition: A | Discharge status: Home / Self Care | Payer: BLUE CROSS/BLUE SHIELD | Source: Ambulatory Visit | Attending: Gynecology | Admitting: Gynecology

## 2017-03-01 DIAGNOSIS — Z1231 Encounter for screening mammogram for malignant neoplasm of breast: Secondary | ICD-10-CM

## 2018-01-27 ENCOUNTER — Other Ambulatory Visit: Payer: Self-pay | Admitting: Gynecology

## 2018-01-27 DIAGNOSIS — Z1231 Encounter for screening mammogram for malignant neoplasm of breast: Secondary | ICD-10-CM

## 2018-03-02 ENCOUNTER — Ambulatory Visit: Payer: BLUE CROSS/BLUE SHIELD

## 2018-04-04 ENCOUNTER — Ambulatory Visit: Payer: BLUE CROSS/BLUE SHIELD

## 2018-08-28 ENCOUNTER — Other Ambulatory Visit: Payer: Self-pay

## 2018-08-28 ENCOUNTER — Encounter: Payer: Self-pay | Admitting: Emergency Medicine

## 2018-08-28 ENCOUNTER — Ambulatory Visit
Admission: EM | Admit: 2018-08-28 | Discharge: 2018-08-28 | Disposition: A | Discharge status: Home / Self Care | Payer: BLUE CROSS/BLUE SHIELD | Attending: Family Medicine | Admitting: Family Medicine

## 2018-08-28 DIAGNOSIS — L03113 Cellulitis of right upper limb: Secondary | ICD-10-CM | POA: Diagnosis not present

## 2018-08-28 MED ORDER — MUPIROCIN 2 % EX OINT: TOPICAL_OINTMENT | CUTANEOUS | 0 refills | Status: AC

## 2018-08-28 MED ORDER — DOXYCYCLINE HYCLATE 100 MG PO CAPS: 100.0000 mg | ORAL_CAPSULE | Freq: Two times a day (BID) | ORAL | 0 refills | Status: AC

## 2018-08-28 NOTE — Discharge Instructions (Addendum)
Take medication as prescribed. Rest. Keep clean. Monitor.   Follow up with your primary care physician this week as needed. Return to Urgent care for new or worsening concerns.

## 2018-08-28 NOTE — ED Provider Notes (Signed)
[facility] ____________________________________________  Time seen: Approximately 2:49 PM  I have reviewed the triage vital signs and the nursing notes.   HISTORY  Chief Complaint Insect Bite (spider) and Cellulitis   HPI Loretta Barker is a 63 y.o. female presenting for evaluation of redness, swelling and warmth to her right forearm.  Patient reports Friday evening she noticed these very small red area and did not think much about it.  Patient reports in the last day she has had increased redness and tenderness to the area.  Patient states that she thinks that it is from an insect bite or spider bite.  No known insect bites.  No tick attachments.  Denies known fevers.  States the area is mildly tender.  Unresolved with cleaning and antibiotic ointment.  Denies other relieving factors reports otherwise doing well.  No recent cough, fevers or sickness. Reports tetanus immunizations up-to-date.  Verita Lamb, NP: PCP   Past Medical History:  Diagnosis Date  . 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 GERD (gastroesophageal reflux disease)    takes Nexium daily  . Hyperlipidemia    takes Crestor nightly  . Hypertension    takes Lisinopril and HCTZ daily  . Insomnia    takes Trazodone nightly    There are no active problems to display for this patient.   Past Surgical History:  Procedure Laterality Date  . ABDOMINAL HYSTERECTOMY    . BACK SURGERY     x 2   . BLADDER SUSPENSION  03/2015  . BREAST BIOPSY Right    Stereo- Benign  . BREAST EXCISIONAL BIOPSY Left 11/19/2015  . BREAST LUMPECTOMY WITH RADIOACTIVE SEED LOCALIZATION Left 11/19/2015   Procedure: LEFT BREAST LUMPECTOMY WITH RADIOACTIVE SEED LOCALIZATION;  Surgeon: Autumn Messing III, MD;  Location: [facility];  Service: General;  Laterality: Left;  . COLONOSCOPY    . LASER ABLATION OF THE CERVIX    . precancer cells removed from cervix  2015  . RADICAL VULVECTOMY  21 yrs ago     No current facility-administered  medications for this encounter.   Current Outpatient Medications:  .  aspirin EC 81 MG tablet, Take 81 mg by mouth daily., Disp: , Rfl:  .  calcium carbonate (OSCAL) 1500 (600 Ca) MG TABS tablet, Take 600 mg of elemental calcium by mouth daily with breakfast., Disp: , Rfl:  .  esomeprazole (NEXIUM) 20 MG capsule, Take 20 mg by mouth daily at 12 noon., Disp: , Rfl:  .  FIBER PO, Take 1 capsule by mouth 2 (two) times daily., Disp: , Rfl:  .  hydrochlorothiazide (MICROZIDE) 12.5 MG capsule, Take 12.5 mg by mouth daily. , Disp: , Rfl:  .  lisinopril (PRINIVIL,ZESTRIL) 5 MG tablet, Take 5 mg by mouth daily., Disp: , Rfl:  .  Potassium 99 MG TABS, Take 99 mg by mouth 2 (two) times daily., Disp: , Rfl:  .  rosuvastatin (CRESTOR) 20 MG tablet, Take 20 mg by mouth daily., Disp: , Rfl:  .  traZODone (DESYREL) 50 MG tablet, TAKE 1 TABLET (50 MG TOTAL) BY MOUTH NIGHTLY., Disp: , Rfl: 1 .  acetaminophen (TYLENOL) 500 MG tablet, Take 1,000 mg by mouth every 6 (six) hours as needed for mild pain., Disp: , Rfl:  .  doxycycline (VIBRAMYCIN) 100 MG capsule, Take 1 capsule (100 mg total) by mouth 2 (two) times daily., Disp: 20 capsule, Rfl: 0 .  mupirocin ointment (BACTROBAN) 2 %, Apply two times a day for 7 days.,  Disp: 22 g, Rfl: 0  Allergies Patient has no known allergies.  Family History  Problem Relation Age of Onset  . Breast cancer Neg Hx     Social History Social History   Tobacco Use  . Smoking status: Former Research scientist (life sciences)  . Smokeless tobacco: Never Used  . Tobacco comment: quit smoking 2 yrs ago  Substance Use Topics  . Alcohol use: Yes    Comment: rarely  . Drug use: No    Review of Systems Constitutional: No fever ENT: No sore throat. Cardiovascular: Denies chest pain. Respiratory: Denies shortness of breath. Gastrointestinal: No abdominal pain.  No nausea, no vomiting Musculoskeletal: Negative for back pain. Skin: Positive for rash.   ____________________________________________    PHYSICAL EXAM:  VITAL SIGNS: ED Triage Vitals  Enc Vitals Group     BP 08/28/18 1435 (!) 144/70     Pulse Rate 08/28/18 1435 92     Resp 08/28/18 1435 14     Temp 08/28/18 1435 98.8 F (37.1 C)     Temp Source 08/28/18 1435 Oral     SpO2 08/28/18 1435 97 %     Weight 08/28/18 1430 160 lb (72.6 kg)     Height 08/28/18 1430 5\' 3"  (1.6 m)     Head Circumference --      Peak Flow --      Pain Score 08/28/18 1430 5     Pain Loc --      Pain Edu? --      Excl. in [place]? --     Constitutional: Alert and oriented. Well appearing and in no acute distress. Eyes: Conjunctivae are normal. ENT      Head: Normocephalic and atraumatic. Cardiovascular: Normal rate, regular rhythm. Grossly normal heart sounds.  Good peripheral circulation. Respiratory: Normal respiratory effort without tachypnea nor retractions. Breath sounds are clear and equal bilaterally. No wheezes, rales, rhonchi. Musculoskeletal: Steady gait.  Neurologic:  Normal speech and language. Speech is normal. No gait instability.  Skin:  Skin is warm, dry.  Except: Right forearm 1 x 1.5 cm area of erythema with mild induration, no fluctuance with 9 x 13 cm surrounding erythema, mild tenderness to palpation, no drainage, skin intact, no circumferential edema, right forearm otherwise nontender and with full range of motion present.  Bilateral distal radial pulses equal nasal palpated.  Bilateral hand grip strong and equal. Psychiatric: Mood and affect are normal. Speech and behavior are normal. Patient exhibits appropriate insight and judgment   ___________________________________________   LABS (all labs ordered are listed, but only abnormal results are displayed)  Labs Reviewed - No data to display ____________________________________________   PROCEDURES Procedures     INITIAL IMPRESSION / ASSESSMENT AND PLAN / ED COURSE  Pertinent labs & imaging results that were available during my care of the patient were reviewed  by me and considered in my medical decision making (see chart for details).  Well-appearing patient.  No acute distress.  Right forearm area of cellulitis.  Will treat with oral doxycycline and topical Bactroban.  Discussed supportive care and monitoring, keep clean.Discussed indication, risks and benefits of medications with patient.  Discussed follow up with Primary care physician this week. Discussed follow up and return parameters including no resolution or any worsening concerns. Patient verbalized understanding and agreed to plan.   ____________________________________________   FINAL CLINICAL IMPRESSION(S) / ED DIAGNOSES  Final diagnoses:  Cellulitis of right upper extremity     ED Discharge Orders  Ordered    doxycycline (VIBRAMYCIN) 100 MG capsule  2 times daily     08/28/18 1450    mupirocin ointment (BACTROBAN) 2 %     08/28/18 1450           Note: This dictation was prepared with Dragon dictation along with smaller phrase technology. Any transcriptional errors that result from this process are unintentional.         Marylene Land, NP 08/28/18 1609

## 2018-08-28 NOTE — ED Triage Notes (Signed)
Patient states that she think she might got bit by a spider on Friday.  Patient c/o redness, swelling, pain and warmth at the site.  Patient denies fever.

## 2018-11-29 ENCOUNTER — Encounter: Payer: Self-pay | Admitting: Emergency Medicine

## 2018-11-29 ENCOUNTER — Other Ambulatory Visit: Payer: Self-pay

## 2018-11-29 ENCOUNTER — Ambulatory Visit
Admission: EM | Admit: 2018-11-29 | Discharge: 2018-11-29 | Disposition: A | Discharge status: Other Acute Inpatient Hospital | Payer: BLUE CROSS/BLUE SHIELD | Attending: Family Medicine | Admitting: Family Medicine

## 2018-11-29 DIAGNOSIS — E876 Hypokalemia: Secondary | ICD-10-CM | POA: Insufficient documentation

## 2018-11-29 LAB — BASIC METABOLIC PANEL
Anion gap: 16 — ABNORMAL HIGH (ref 5–15)
BUN: 15 mg/dL (ref 8–23)
CO2: 32 mmol/L (ref 22–32)
Calcium: 7.8 mg/dL — ABNORMAL LOW (ref 8.9–10.3)
Chloride: 91 mmol/L — ABNORMAL LOW (ref 98–111)
Creatinine, Ser: 0.96 mg/dL (ref 0.44–1.00)
GFR calc Af Amer: >60 mL/min (ref >60)
GFR calc non Af Amer: >60 mL/min (ref >60)
Glucose, Bld: 181 mg/dL — ABNORMAL HIGH (ref 70–99)
Potassium: 2.3 mmol/L — CL (ref 3.5–5.1)
Sodium: 139 mmol/L (ref 135–145)

## 2018-11-29 NOTE — ED Provider Notes (Signed)
[facility]    CSN: [id number] Arrival date & time: 11/29/18  1909  History   Chief Complaint Chief Complaint  Patient presents with  . Labs Only   HPI  63 year old female presents for evaluation given recent abnormal laboratory studies.  Patient was seen by her primary care physician earlier today for routine follow-up.  Labs were drawn and she was found to have a critically low potassium of 2.5.  CO2 was also greater than 40.  She was advised to go to the ER.  She presents for repeat labs today.  Patient states that she is currently feeling well.  She reports bilateral arm pain secondary to immunizations that she received at her doctor's office.  She has no other complaints at this time.  Denies chest pain or shortness of breath.  She is otherwise feeling well.  She is perplexed at the cause of her hypokalemia.  She is quite concerned.  No other complaints at this time.  PMH, Surgical Hx, Family Hx, Social History reviewed and updated as below.  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   Migraine stopped in her 70's   Depression    Wears glasses    GERD (gastroesophageal reflux disease)    Vulvar cancer (CMS-HCC)  21 years ago  DM-2   Surgical Hx: VULVA SURGERY      PR DESTRUCTION,LESION(S),VULVA,SIMPLE 06/26/2013 Bilateral Procedure: DESTRUCTION OF LESION(S), VULVA; SIMPLE (EG, LASER SURGERY, ELECTROSURGERY, CRYOSURGERY, CHEMOSURGERY); Surgeon: Marti Sleigh, MD; Location: [facility] OR [facility]; Service: Gynecology Oncology   BACK SURGERY   x2 Lumbar    PR LAPAROSCOPY W TOT HYSTERECTUTERUS <=250 [address] [place] TUBE/OVARY 03/31/2015 Midline Procedure: robotic assisted total laparoscopic hysterectomy, bilateral salpingo-oophorectomyinsertion, cystourethroscopy; Surgeon: Azzie Glatter, MD; Location: OR  [facility]; Service: Gynecology  Medical devices from this surgery are in the Implants section.   PR LAPAROSCOPY, SURG, COLPOPEXY 03/31/2015 Midline Procedure: ROBOTIC LAPAROSCOPY, SURGICAL, COLPOPEXY (SUSPENSION OF VAGINAL APEX); Surgeon: Azzie Glatter, MD; Location: OR [facility]; Service: Gynecology  Medical devices from this surgery are in the Implants section.   PR SLING OPER STRES INCONTINENCE 03/31/2015 Midline Procedure: SLING OPERATION FOR STRESS INCONTINENCE (EG, FASCIA OR SYNTHETIC); Surgeon: Azzie Glatter, MD; Location: OR [facility]; Service: Gynecology  Medical devices from this surgery are in the Implants section.   PR CYSTOURETHROSCOPY 03/31/2015 Midline Procedure: CYSTOURETHROSCOPY (SEPARATE PROCEDURE); Surgeon: Azzie Glatter, MD; Location: OR [facility]; Service: Gynecology  Medical devices from this surgery are in the Implants section.   LAPAROSCOPIC TOTAL HYSTERECTOMY      BREAST LUMPECTOMY  Left    HYSTERECTOMY      PR REVISION VAGINAL GRAFT 11/30/2016 Midline Procedure: Revision / excision vaginal mesh, vaginal approach; Surgeon: Azzie Glatter, MD; Location: [facility]; Service: Gynecology   PR CYSTOURETHROSCOPY 11/30/2016 Midline Procedure: CYSTOURETHROSCOPY (SEPARATE PROCEDURE); Surgeon: Azzie Glatter, MD; Location: [facility]; Service: Gynecology      Home Medications    Prior to Admission medications   Medication Sig Start Date End Date Taking? Authorizing Provider  acetaminophen (TYLENOL) 500 MG tablet Take 1,000 mg by mouth every 6 (six) hours as needed for mild pain.   Yes [provider]  aspirin EC 81 MG tablet Take 81 mg by mouth daily.   Yes [provider]  calcium carbonate (OSCAL) 1500 (600 Ca) MG TABS tablet Take 600  mg of elemental calcium by mouth daily with breakfast.   Yes [provider]  esomeprazole (NEXIUM) 20 MG capsule Take 20 mg by mouth daily at 12 noon.   Yes [provider]   FIBER PO Take 1 capsule by mouth 2 (two) times daily.   Yes [provider]  hydrochlorothiazide (MICROZIDE) 12.5 MG capsule Take 12.5 mg by mouth daily.    Yes [provider]  lisinopril (PRINIVIL,ZESTRIL) 5 MG tablet Take 5 mg by mouth daily.   Yes [provider]  mupirocin ointment (BACTROBAN) 2 % Apply two times a day for 7 days. 08/28/18  Yes Marylene Land, NP  Potassium 99 MG TABS Take 99 mg by mouth 2 (two) times daily.   Yes [provider]  rosuvastatin (CRESTOR) 20 MG tablet Take 20 mg by mouth daily.   Yes [provider]  traZODone (DESYREL) 50 MG tablet TAKE 1 TABLET (50 MG TOTAL) BY MOUTH NIGHTLY. 08/28/15  Yes [provider]  doxycycline (VIBRAMYCIN) 100 MG capsule Take 1 capsule (100 mg total) by mouth 2 (two) times daily. 08/28/18   Marylene Land, NP    Family History Family History  Problem Relation Age of Onset  . Breast cancer Neg Hx     Social History Social History   Tobacco Use  . Smoking status: Former Research scientist (life sciences)  . Smokeless tobacco: Never Used  . Tobacco comment: quit smoking 2 yrs ago  Substance Use Topics  . Alcohol use: Yes    Comment: rarely  . Drug use: No     Allergies   Patient has no known allergies.   Review of Systems Review of Systems  Constitutional: Negative.   Respiratory: Negative.   Cardiovascular: Negative.    Physical Exam Triage Vital Signs ED Triage Vitals  Enc Vitals Group     BP 11/29/18 1920 (!) 153/120     Pulse Rate 11/29/18 1920 (!) 102     Resp 11/29/18 1920 18     Temp 11/29/18 1920 98.2 F (36.8 C)     Temp Source 11/29/18 1920 Oral     SpO2 11/29/18 1920 96 %     Weight --      Height --      Head Circumference --      Peak Flow --      Pain Score 11/29/18 1916 0     Pain Loc --      Pain Edu? --      Excl. in [place]? --    Updated Vital Signs BP (!) 153/120 (BP Location: Right Arm)   Pulse (!) 102   Temp 98.2 F (36.8 C) (Oral)   Resp 18   SpO2  96%   Visual Acuity Right Eye Distance:   Left Eye Distance:   Bilateral Distance:    Right Eye Near:   Left Eye Near:    Bilateral Near:     Physical Exam Vitals signs and nursing note reviewed.  Constitutional:      Appearance: Normal appearance.  HENT:     Head: Normocephalic and atraumatic.  Eyes:     General:        Right eye: No discharge.        Left eye: No discharge.     Conjunctiva/sclera: Conjunctivae normal.  Cardiovascular:     Rate and Rhythm: Regular rhythm. Tachycardia present.     Heart sounds: No murmur.  Pulmonary:     Effort: Pulmonary effort is normal.  Breath sounds: Normal breath sounds. No wheezing, rhonchi or rales.  Abdominal:     General: There is no distension.     Palpations: Abdomen is soft.     Tenderness: There is no abdominal tenderness.  Neurological:     Mental Status: She is alert.  Psychiatric:        Mood and Affect: Mood normal.        Behavior: Behavior normal.    UC Treatments / Results  Labs (all labs ordered are listed, but only abnormal results are displayed) Labs Reviewed  BASIC METABOLIC PANEL - Abnormal; Notable for the following components:      Result Value   Potassium 2.3 (*)    Chloride 91 (*)    Glucose, Bld 181 (*)    Calcium 7.8 (*)    Anion gap 16 (*)    All other components within normal limits    EKG Interpretation: Normal sinus rhythm with rate of 100.  Nonspecific T wave abnormality which is likely secondary to the patient's hypokalemia.  Normal axis.  Normal intervals.  Radiology No results found.  Procedures Procedures (including critical care time)  Medications Ordered in UC Medications - No data to display  Initial Impression / Assessment and Plan / UC Course  I have reviewed the triage vital signs and the nursing notes.  Pertinent labs & imaging results that were available during my care of the patient were reviewed by me and considered in my medical decision making (see chart for  details).    63 year old female presents with severe hypokalemia. K+ 2.3 here. Feeling well currently. Sending directly to the ER.  Final Clinical Impressions(s) / UC Diagnoses   Final diagnoses:  Hypokalemia     Discharge Instructions     Go straight to the ER.  Take care  Dr. Lacinda Axon   ED Prescriptions    None     PDMP not reviewed this encounter.   Coral Spikes, [place] 11/29/18 2021

## 2018-11-29 NOTE — Discharge Instructions (Signed)
Go straight to the ER. ° °Take care ° °Dr. Schuyler Olden  °

## 2018-11-29 NOTE — ED Triage Notes (Signed)
Patient states she is here to have her Potassium level rechecked. Potassium level was a 2.5.

## 2019-03-28 ENCOUNTER — Other Ambulatory Visit: Payer: Self-pay | Admitting: Gynecology

## 2019-03-28 DIAGNOSIS — Z1231 Encounter for screening mammogram for malignant neoplasm of breast: Secondary | ICD-10-CM

## 2019-04-20 ENCOUNTER — Ambulatory Visit: Payer: BLUE CROSS/BLUE SHIELD

## 2019-05-10 ENCOUNTER — Ambulatory Visit
Admission: RE | Admit: 2019-05-10 | Discharge: 2019-05-10 | Disposition: A | Discharge status: Home / Self Care | Payer: BLUE CROSS/BLUE SHIELD | Source: Ambulatory Visit | Attending: Gynecology | Admitting: Gynecology

## 2019-05-10 ENCOUNTER — Other Ambulatory Visit: Payer: Self-pay

## 2019-05-10 DIAGNOSIS — Z1231 Encounter for screening mammogram for malignant neoplasm of breast: Secondary | ICD-10-CM

## 2020-11-17 ENCOUNTER — Other Ambulatory Visit: Payer: Self-pay | Admitting: Family

## 2020-11-17 DIAGNOSIS — Z1231 Encounter for screening mammogram for malignant neoplasm of breast: Secondary | ICD-10-CM

## 2020-12-30 ENCOUNTER — Ambulatory Visit
Admission: RE | Admit: 2020-12-30 | Discharge: 2020-12-30 | Disposition: A | Discharge status: Home / Self Care | Payer: BLUE CROSS/BLUE SHIELD | Source: Ambulatory Visit | Attending: Family | Admitting: Family

## 2020-12-30 ENCOUNTER — Other Ambulatory Visit: Payer: Self-pay

## 2020-12-30 DIAGNOSIS — Z1231 Encounter for screening mammogram for malignant neoplasm of breast: Secondary | ICD-10-CM | POA: Diagnosis present

## 2021-03-19 LAB — COLOGUARD: COLOGUARD: NEGATIVE

## 2021-12-16 ENCOUNTER — Other Ambulatory Visit: Payer: Self-pay

## 2021-12-16 DIAGNOSIS — Z1231 Encounter for screening mammogram for malignant neoplasm of breast: Secondary | ICD-10-CM

## 2021-12-31 ENCOUNTER — Ambulatory Visit
Admission: RE | Admit: 2021-12-31 | Discharge: 2021-12-31 | Disposition: A | Discharge status: Home / Self Care | Payer: Medicare HMO | Source: Ambulatory Visit | Attending: Family | Admitting: Family

## 2021-12-31 DIAGNOSIS — Z1231 Encounter for screening mammogram for malignant neoplasm of breast: Secondary | ICD-10-CM | POA: Diagnosis present

## 2022-05-08 IMAGING — MG MM DIGITAL SCREENING BILAT W/ TOMO AND CAD
8 series · 8 of 24 positions shown · non-contrast
Comparison: Previous exam(s).

CLINICAL DATA: Screening.

EXAM:
DIGITAL SCREENING BILATERAL MAMMOGRAM WITH TOMOSYNTHESIS AND CAD
TECHNIQUE: Bilateral screening digital craniocaudal and mediolateral oblique
mammograms were obtained. Bilateral screening digital breast
tomosynthesis was performed. The images were evaluated with
computer-aided detection.

[L CC synth-2D]
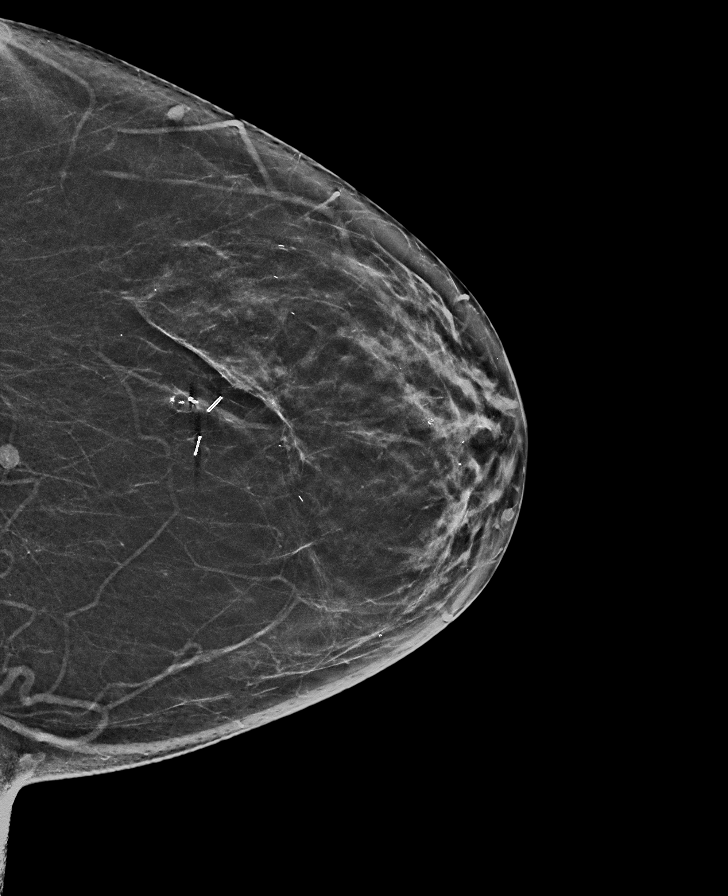

[L MLO synth-2D]
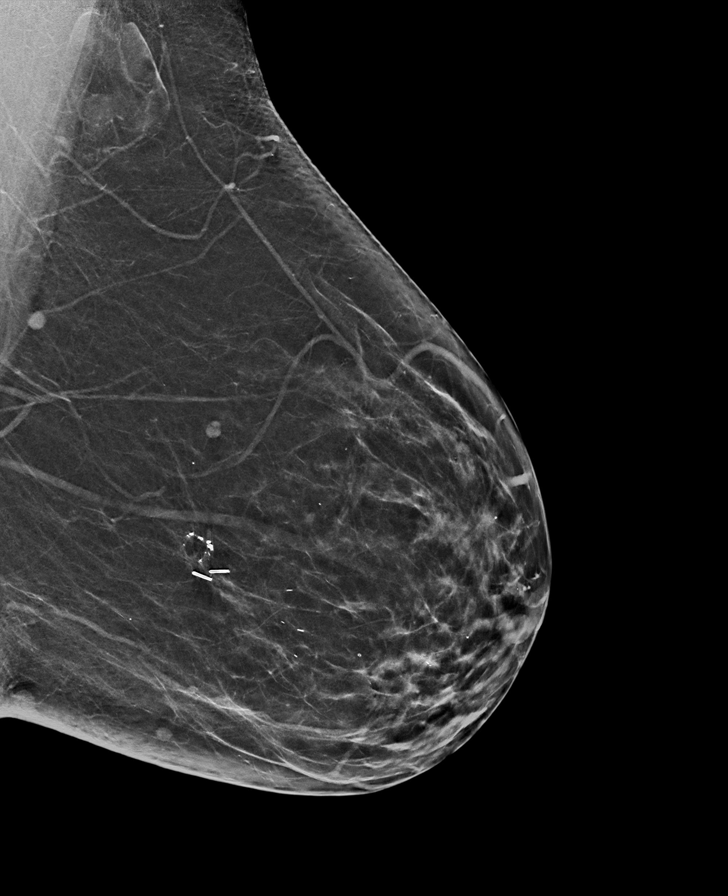

[R MLO synth-2D]
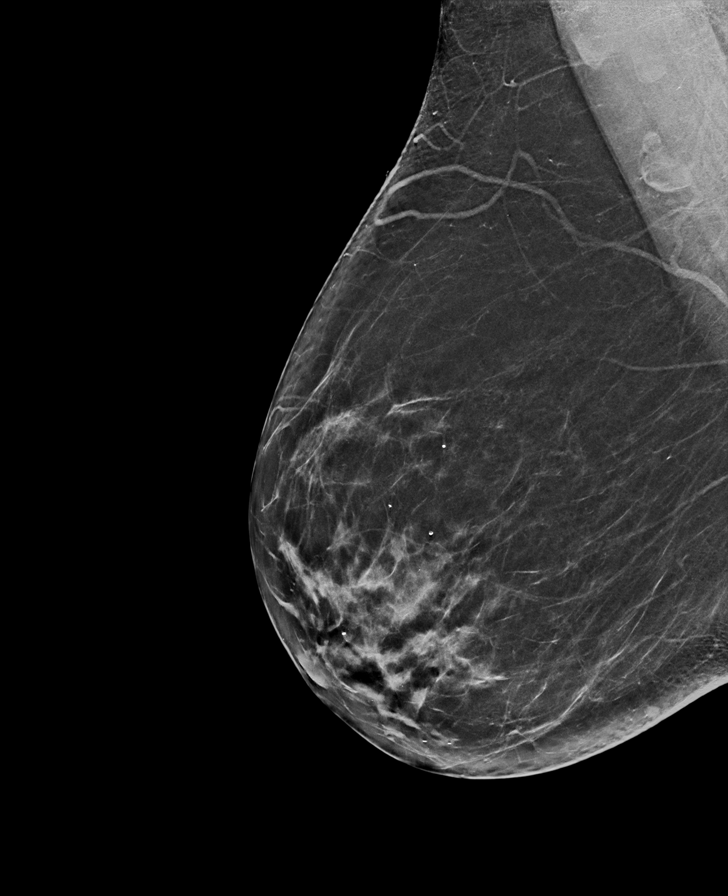

[R CC synth-2D]
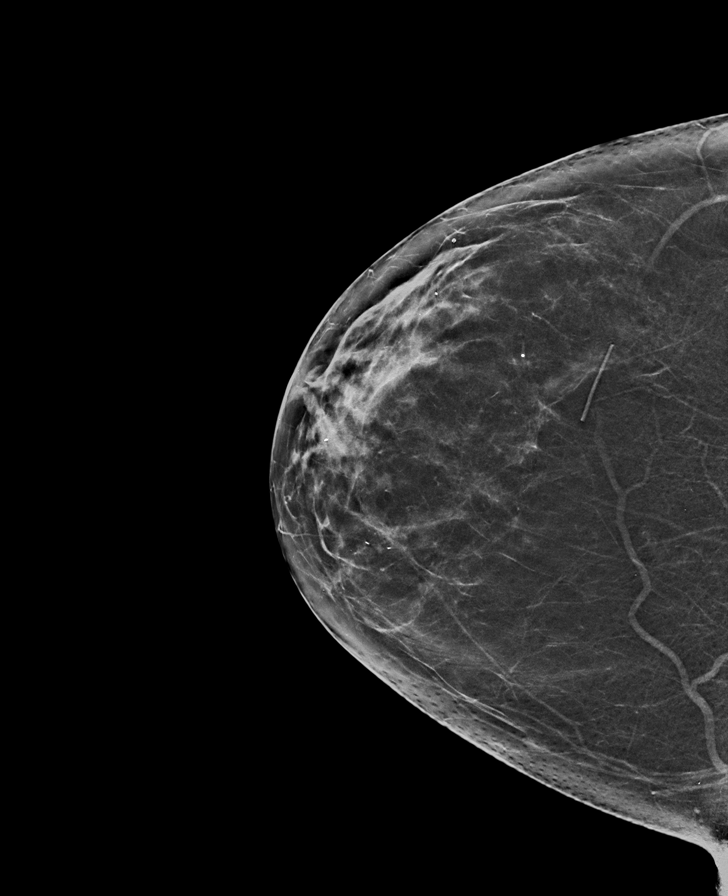

[L CC tomo · tomo slice 33/65.0]
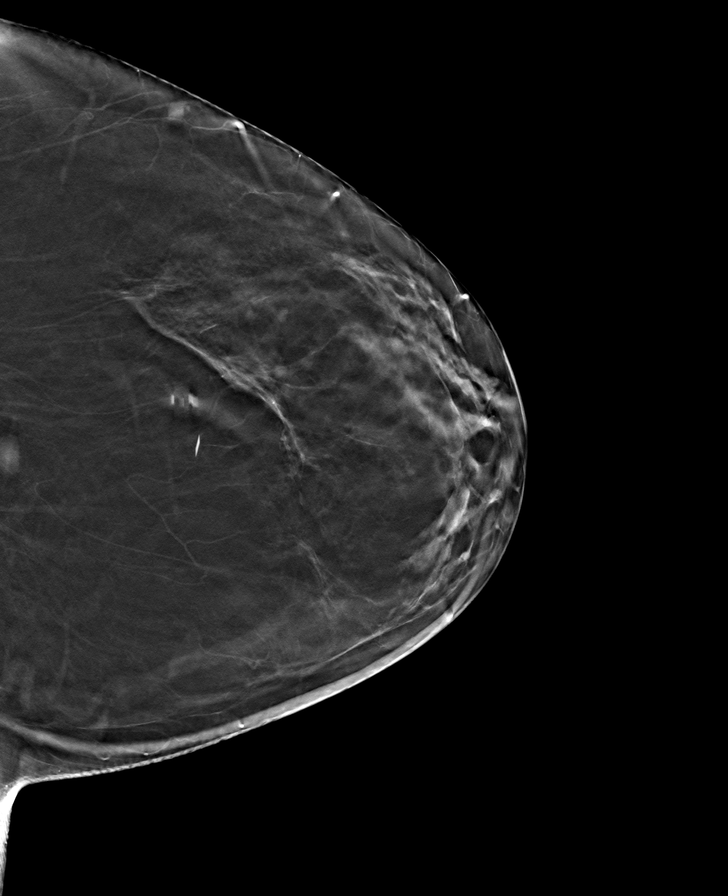

[R MLO tomo · tomo slice 37/74.0]
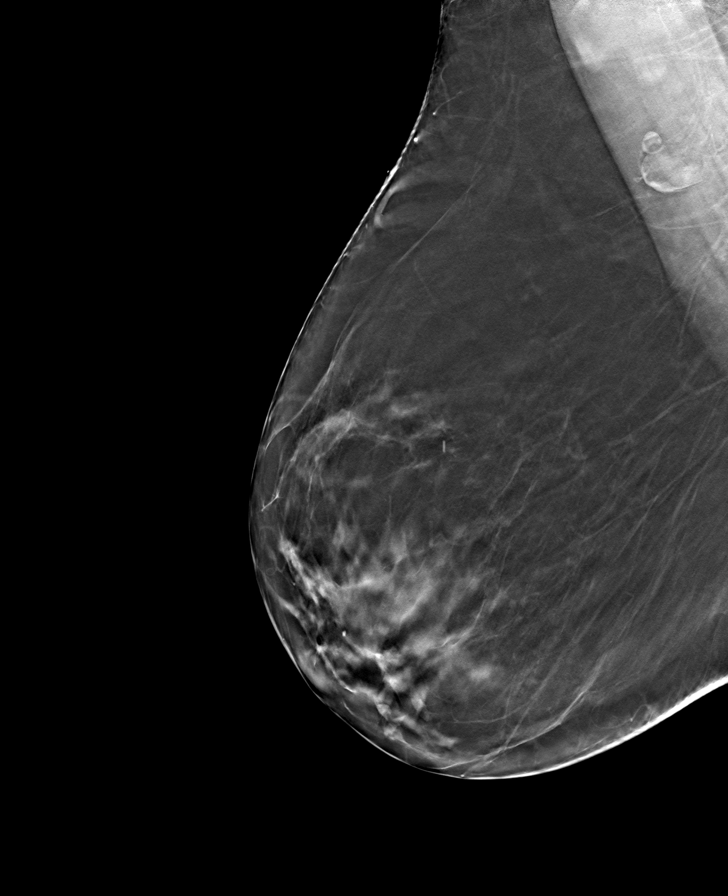

[R CC tomo · tomo slice 35/70.0]
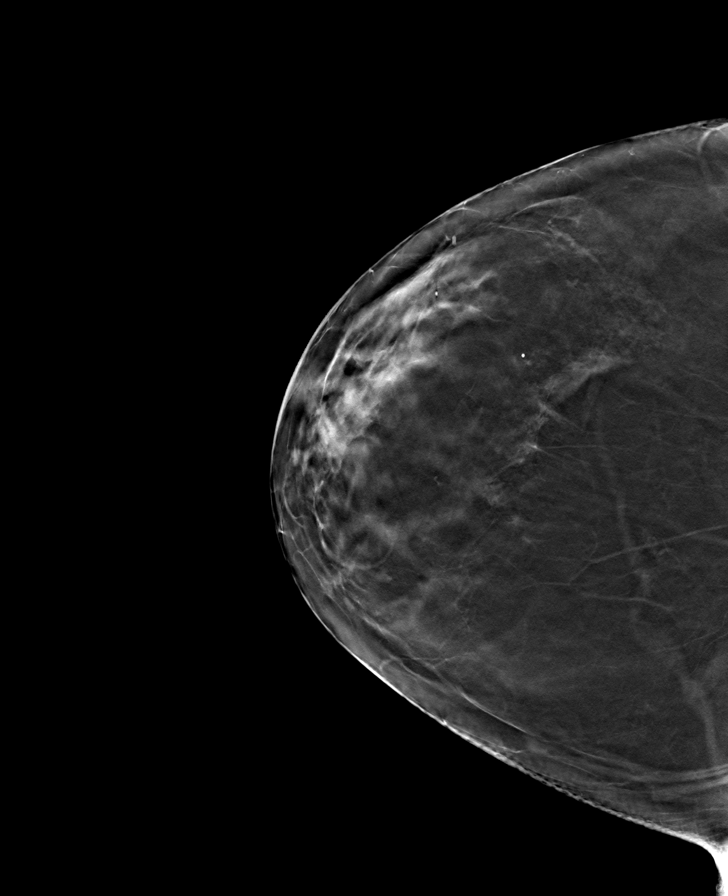

[L MLO tomo · tomo slice 35/70.0]
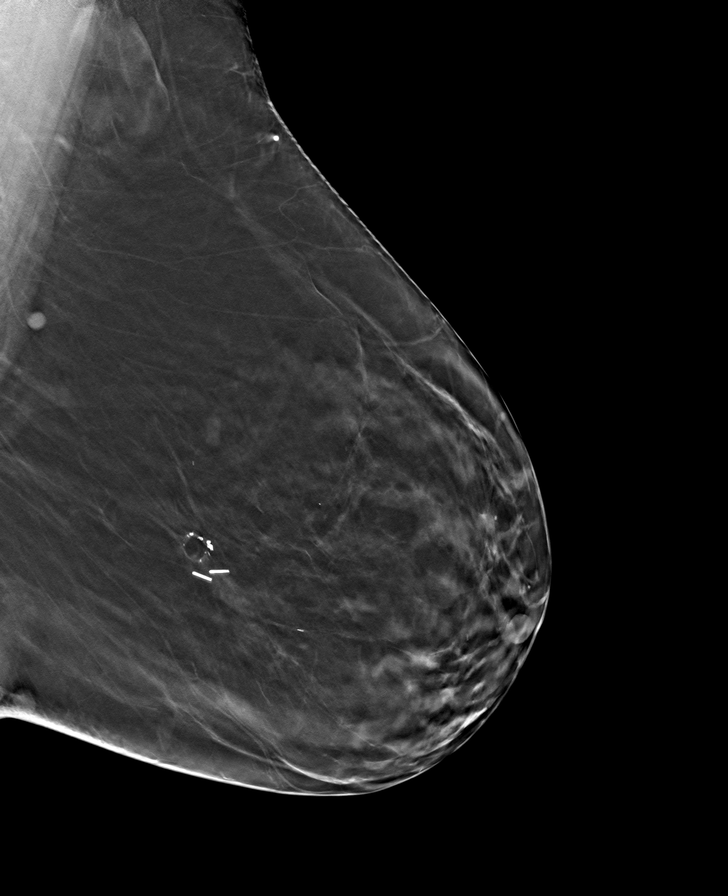

[8 of 24 positions shown; findings below may reference images not displayed]

ACR Breast Density Category b: There are scattered areas of
fibroglandular density.
FINDINGS: There are no findings suspicious for malignancy. Surgical changes
within both breasts again noted.
IMPRESSION: No mammographic evidence of malignancy. A result letter of this
screening mammogram will be mailed directly to the patient.

RECOMMENDATION:
Screening mammogram in one year. (Code:2N-X-VJ7)

BI-RADS CATEGORY  2: Benign.

## 2022-11-03 ENCOUNTER — Ambulatory Visit
Admission: EM | Admit: 2022-11-03 | Discharge: 2022-11-03 | Disposition: A | Discharge status: Home / Self Care | Payer: Medicare HMO | Attending: Family Medicine | Admitting: Family Medicine

## 2022-11-03 ENCOUNTER — Other Ambulatory Visit: Payer: Self-pay

## 2022-11-03 DIAGNOSIS — R42 Dizziness and giddiness: Secondary | ICD-10-CM

## 2022-11-03 DIAGNOSIS — I10 Essential (primary) hypertension: Secondary | ICD-10-CM | POA: Diagnosis not present

## 2022-11-03 HISTORY — DX: Zoster without complications: B02.9

## 2022-11-03 NOTE — ED Provider Notes (Addendum)
[facility]    CSN: [id number] Arrival date & time: 11/03/22  1354      History   Chief Complaint No chief complaint on file.   HPI MAIKAYLA BEGGS is a 67 y.o. female.   HPI  Ossie presents for dizziness this morning after waking up.  She had to hold on to feed her animals. Felt like she was going to fall over.   BP 187/87 and her blood sugar was 130 this morning.  Repeat BP 175/90 after waiting a few minutes. Says she was having a panic attack.  Denies headache and cough. Endorses sinus pressure but this is not new. Follows with ENT.      Takes amlodipine 5 mg and Lisinopril 20 mg. Denies missing doses of antihypertensive medications. Denies chest pain, palpitations, lower extremity edema, exertional dyspnea, lightheadedness, headaches and vision changes.      Past Medical History:  Diagnosis Date   Cancer (HCC)    vulva cancer   GERD (gastroesophageal reflux disease)    takes Nexium daily   Hyperlipidemia    takes Crestor nightly   Hypertension    takes Lisinopril and HCTZ daily   Insomnia    takes Trazodone nightly   Shingles     There are no problems to display for this patient.   Past Surgical History:  Procedure Laterality Date   ABDOMINAL HYSTERECTOMY     BACK SURGERY     x 2    BLADDER SUSPENSION  03/2015   BREAST BIOPSY Right    Stereo- Benign   BREAST EXCISIONAL BIOPSY Left 11/19/2015   BREAST LUMPECTOMY WITH RADIOACTIVE SEED LOCALIZATION Left 11/19/2015   Procedure: LEFT BREAST LUMPECTOMY WITH RADIOACTIVE SEED LOCALIZATION;  Surgeon: Chevis Pretty III, MD;  Location: [facility] OR;  Service: General;  Laterality: Left;   COLONOSCOPY     LASER ABLATION OF THE CERVIX     precancer cells removed from cervix  2015   RADICAL VULVECTOMY  21 yrs ago    OB History   No obstetric history on file.      Home Medications    Prior to Admission medications   Medication Sig Start Date End Date Taking? Authorizing Provider  acetaminophen  (TYLENOL) 500 MG tablet Take 1,000 mg by mouth every 6 (six) hours as needed for mild pain.   Yes [provider]  aspirin EC 81 MG tablet Take 81 mg by mouth daily.   Yes [provider]  esomeprazole (NEXIUM) 20 MG capsule Take 20 mg by mouth daily at 12 noon.   Yes [provider]  FIBER PO Take 1 capsule by mouth 2 (two) times daily.   Yes [provider]  lisinopril (PRINIVIL,ZESTRIL) 5 MG tablet Take 5 mg by mouth daily.   Yes [provider]  Potassium 99 MG TABS Take 99 mg by mouth 2 (two) times daily.   Yes [provider]  rosuvastatin (CRESTOR) 20 MG tablet Take 20 mg by mouth daily.   Yes [provider]  traZODone (DESYREL) 50 MG tablet TAKE 1 TABLET (50 MG TOTAL) BY MOUTH NIGHTLY. 08/28/15  Yes [provider]  calcium carbonate (OSCAL) 1500 (600 Ca) MG TABS tablet Take 600 mg of elemental calcium by mouth daily with breakfast.    [provider]  doxycycline (VIBRAMYCIN) 100 MG capsule Take 1 capsule (100 mg total) by mouth 2 (two) times daily. 08/28/18   Renford Dills, NP  hydrochlorothiazide (MICROZIDE) 12.5 MG capsule Take 12.5 mg  by mouth daily.     [provider]  mupirocin ointment (BACTROBAN) 2 % Apply two times a day for 7 days. 08/28/18   Renford Dills, NP    Family History Family History  Problem Relation Age of Onset   Breast cancer Neg Hx     Social History Social History   Tobacco Use   Smoking status: Former   Smokeless tobacco: Never   Tobacco comments:    quit smoking 2 yrs ago  Vaping Use   Vaping status: Never Used  Substance Use Topics   Alcohol use: Yes    Comment: rarely   Drug use: No     Allergies   Patient has no known allergies.   Review of Systems Review of Systems: negative unless otherwise stated in HPI.      Physical Exam Triage Vital Signs ED Triage Vitals  Encounter Vitals Group     BP 11/03/22 1429 (!) 153/85     Systolic BP  Percentile --      Diastolic BP Percentile --      Pulse Rate 11/03/22 1429 92     Resp 11/03/22 1429 18     Temp 11/03/22 1429 98.5 F (36.9 C)     Temp src --      SpO2 11/03/22 1429 95 %     Weight --      Height --      Head Circumference --      Peak Flow --      Pain Score 11/03/22 1425 0     Pain Loc --      Pain Education --      Exclude from Growth Chart --    Orthostatic VS for the past 24 hrs:  BP- Lying Pulse- Lying BP- Sitting Pulse- Sitting BP- Standing at 0 minutes Pulse- Standing at 0 minutes  11/03/22 1451 154/90 97 (!) 151/96 100 (!) 163/95 91    Updated Vital Signs BP (!) 153/85   Pulse 92   Temp 98.5 F (36.9 C)   Resp 18   SpO2 95%   Visual Acuity Right Eye Distance:   Left Eye Distance:   Bilateral Distance:    Right Eye Near:   Left Eye Near:    Bilateral Near:     Physical Exam GEN:     alert, well appearing elderly female and in no distress    HENT:  mucus membranes moist, oropharyngeal without lesions or erythema,  nares patent, no nasal discharge, no frontal or maxillary sinus tenderness EYES:   pupils equal and reactive, EOM intact NECK:  supple, good ROM RESP:  clear to auscultation bilaterally, no increased work of breathing  CVS:   regular rate and rhythm, no murmur, no carotid bruit EXT:   Good ROM, atraumatic, non-tender NEURO:  alert, oriented x4, speech normal, CN 2-12 grossly intact, no facial droop,  sensation grossly intact, strength 5/5 bilateral UE and LE, normal coordination, normal finger to nose,Negative Romberg , normal gait Skin:   warm and dry, no rash, normal skin turgor      UC Treatments / Results  Labs (all labs ordered are listed, but only abnormal results are displayed) Labs Reviewed - No data to display  EKG  If EKG performed, see my interpretation in the MDM section  Radiology No results found.   Procedures Procedures (including critical care time)  Medications Ordered in UC Medications - No  data to display  Initial Impression / Assessment and Plan /  UC Course  I have reviewed the triage vital signs and the nursing notes.  Pertinent labs & imaging results that were available during my care of the patient were reviewed by me and considered in my medical decision making (see chart for details).       Patient is a 67 y.o. female  who presents for acute on chronic dizziness with elevated blood pressure. Has history of vertigo.  Overall, patient is nontoxic-appearing and afebrile.  Her blood pressure is slightly elevated here at 153/85 despite her home readings.  She is satting 95% on room air. She is afebrile.  She was most concerned about her blood pressure. Reassurance provided.    Orthostatic vitals were unremarkable.  EKG showing normal sinus rhythm without acute ST or T wave changes.  She does not have a headache. There has been no syncope.  I doubt acute cerebellar CVA. History not consistent with complex migraine. She does not have significant respiratory symptoms to suggest labyrinthitis. No hearing loss to suggest Mnire's disease. No foreign bodies or effusion seen.  There is no evidence of acute otitis media or externa.  I do not see any head imaging in the past 10 years.  She does note that her dizziness can be, by turning her head to one side.  I am concerned that she may have carotid stenosis.  Recommended she speak with her PCP to order carotid dopplers.  Reported evaluation by ENT for audiology testing. She was advised to get a test to provoke the dizziness but she declined it.  I assume this was a tilt test but I am unsure as I am unable to see her ENT notes.  She has some meclizine at home.  Strict ED precautions reviewed and patient voiced understanding. Pt may need vestibular rehab, if symptoms not improving.     Final Clinical Impressions(s) / UC Diagnoses   Final diagnoses:  Dizziness  Elevated blood pressure reading in office with diagnosis of hypertension      Discharge Instructions      Follow up with your primary care provider to discuss getting an ultrasound of your carotid arteries as this could be a cause of your dizziness.  IF your dizziness is accompanied with chest pain, new headache, shortness of breath, confusion, numbness or tingling go to the emergency department or call the ambulance.   Go to ED for red flag symptoms, including; fevers you cannot reduce with Tylenol/Motrin, severe headaches, vision changes, numbness/weakness in part of the body, lethargy, confusion, intractable vomiting, severe dehydration, chest pain, breathing difficulty, severe persistent abdominal or pelvic pain, signs of severe infection (increased redness, swelling of an area), feeling faint or passing out, dizziness, etc. You should especially go to the ED for sudden acute worsening of condition if you do not elect to go at this time.       ED Prescriptions   None    PDMP not reviewed this encounter.     Katha Cabal, DO 11/03/22 1709

## 2022-11-03 NOTE — ED Triage Notes (Signed)
Woke up this morning feeling like her "head was full" denis headache but also extremely dizzy with nausea. Checked BP and found it was elevated.

## 2022-11-03 NOTE — Discharge Instructions (Addendum)
Follow up with your primary care provider to discuss getting an ultrasound of your carotid arteries as this could be a cause of your dizziness.  IF your dizziness is accompanied with chest pain, new headache, shortness of breath, confusion, numbness or tingling go to the emergency department or call the ambulance.   Go to ED for red flag symptoms, including; fevers you cannot reduce with Tylenol/Motrin, severe headaches, vision changes, numbness/weakness in part of the body, lethargy, confusion, intractable vomiting, severe dehydration, chest pain, breathing difficulty, severe persistent abdominal or pelvic pain, signs of severe infection (increased redness, swelling of an area), feeling faint or passing out, dizziness, etc. You should especially go to the ED for sudden acute worsening of condition if you do not elect to go at this time.

## 2023-03-03 ENCOUNTER — Other Ambulatory Visit: Payer: Self-pay | Admitting: Family

## 2023-03-03 DIAGNOSIS — Z780 Asymptomatic menopausal state: Secondary | ICD-10-CM

## 2023-03-03 DIAGNOSIS — Z1231 Encounter for screening mammogram for malignant neoplasm of breast: Secondary | ICD-10-CM

## 2023-04-06 ENCOUNTER — Ambulatory Visit
Admission: RE | Admit: 2023-04-06 | Discharge: 2023-04-06 | Disposition: A | Discharge status: Home / Self Care | Source: Ambulatory Visit | Attending: Family | Admitting: Family

## 2023-04-06 DIAGNOSIS — Z1231 Encounter for screening mammogram for malignant neoplasm of breast: Secondary | ICD-10-CM | POA: Diagnosis present
